# Patient Record
Sex: Male | Born: 1965 | Race: White | Hispanic: No | Marital: Married | Smoking: Former smoker
Health system: Southern US, Community
[De-identification: ages and names within clinical notes are randomized; demographics above are authoritative.]

## PROBLEM LIST (undated history)

## (undated) DIAGNOSIS — J45909 Unspecified asthma, uncomplicated: Secondary | ICD-10-CM

## (undated) HISTORY — PX: WISDOM TOOTH EXTRACTION: SHX21

## (undated) HISTORY — PX: NM RENAL LASIX (ARMC HX): HXRAD1213

---

## 2008-06-26 ENCOUNTER — Encounter: Admission: RE | Admit: 2008-06-26 | Discharge: 2008-06-26 | Payer: Self-pay | Admitting: Family Medicine

## 2012-02-06 ENCOUNTER — Encounter (INDEPENDENT_AMBULATORY_CARE_PROVIDER_SITE_OTHER): Payer: Managed Care, Other (non HMO)

## 2012-02-06 DIAGNOSIS — I4949 Other premature depolarization: Secondary | ICD-10-CM

## 2016-06-05 ENCOUNTER — Encounter (HOSPITAL_BASED_OUTPATIENT_CLINIC_OR_DEPARTMENT_OTHER): Payer: Self-pay

## 2016-06-05 ENCOUNTER — Emergency Department (HOSPITAL_BASED_OUTPATIENT_CLINIC_OR_DEPARTMENT_OTHER)
Admission: EM | Admit: 2016-06-05 | Discharge: 2016-06-05 | Disposition: A | Discharge status: Home / Self Care | Payer: BLUE CROSS/BLUE SHIELD | Attending: Emergency Medicine | Admitting: Emergency Medicine

## 2016-06-05 ENCOUNTER — Emergency Department (HOSPITAL_BASED_OUTPATIENT_CLINIC_OR_DEPARTMENT_OTHER): Payer: BLUE CROSS/BLUE SHIELD

## 2016-06-05 DIAGNOSIS — R509 Fever, unspecified: Secondary | ICD-10-CM | POA: Insufficient documentation

## 2016-06-05 DIAGNOSIS — R69 Illness, unspecified: Secondary | ICD-10-CM

## 2016-06-05 DIAGNOSIS — Z87891 Personal history of nicotine dependence: Secondary | ICD-10-CM | POA: Insufficient documentation

## 2016-06-05 DIAGNOSIS — R05 Cough: Secondary | ICD-10-CM | POA: Diagnosis present

## 2016-06-05 DIAGNOSIS — R5383 Other fatigue: Secondary | ICD-10-CM | POA: Diagnosis not present

## 2016-06-05 DIAGNOSIS — J111 Influenza due to unidentified influenza virus with other respiratory manifestations: Secondary | ICD-10-CM

## 2016-06-05 MED ORDER — IBUPROFEN 800 MG PO TABS
800.0000 mg | ORAL_TABLET | Freq: Once | ORAL | Status: AC
  Administered 2016-06-05: 800 mg via ORAL
  Filled 2016-06-05: qty 1

## 2016-06-05 MED ORDER — OSELTAMIVIR PHOSPHATE 75 MG PO CAPS: 75.0000 mg | ORAL_CAPSULE | Freq: Two times a day (BID) | ORAL | 0 refills | Status: DC

## 2016-06-05 NOTE — ED Provider Notes (Signed)
[facility] Provider Note   CSN: [id number] Arrival date & time: 06/05/16  1708  By signing my name below, I, Paul Kelly, attest that this documentation has been prepared under the direction and in the presence of Gwyneth Sprout, MD. Electronically signed, Paul Kelly, ED Scribe. 06/05/16. 8:33 PM.  History   Chief Complaint Chief Complaint  Patient presents with  . Cough    HPI HPI Comments: Paul Kelly is a 51 y.o. male who presents to the Emergency Department complaining of persistent cough, fever and body aches that started two days ago. Pt did not receive his flu shot this year. Pt reports fever tmax 103 at home and is 103.1 on arrival. He has taken Tylenol at home with no significant relief of symptoms. Pt works at [employer]. No shortness of breath.   The history is provided by the patient. No language interpreter was used.    History reviewed. No pertinent past medical history.  There are no active problems to display for this patient.   Past Surgical History:  Procedure Laterality Date  . NM RENAL LASIX ([facility] HX)       Home Medications    Prior to Admission medications   Not on File   Family History No family history on file.  Social History Social History  Substance Use Topics  . Smoking status: Former Games developer  . Smokeless tobacco: Never Used  . Alcohol use Yes   Allergies   Patient has no known allergies.   Review of Systems Review of Systems  Constitutional: Positive for chills, fatigue and fever.  HENT: Positive for congestion.   Respiratory: Positive for cough. Negative for shortness of breath.   All other systems reviewed and are negative.    Physical Exam Updated Vital Signs BP 113/69 (BP Location: Right Arm)   Pulse 116   Temp 100.3 F (37.9 C) (Oral)   Resp 20   Ht 6' (1.829 m)   Wt 202 lb (91.6 kg)   SpO2 100%   BMI 27.40 kg/m   Physical Exam  Constitutional: He is oriented to person, place, and time. He  appears well-developed and well-nourished.  HENT:  Head: Normocephalic and atraumatic.  Eyes: Conjunctivae are normal.  Neck: Neck supple.  Cardiovascular: Regular rhythm.   Tachycardia  Pulmonary/Chest: Effort normal and breath sounds normal.  Abdominal: Soft. Bowel sounds are normal.  Musculoskeletal: Normal range of motion.  Neurological: He is alert and oriented to person, place, and time.  Skin: Skin is warm.  Clammy skin  Psychiatric: He has a normal mood and affect. His behavior is normal.  Nursing note and vitals reviewed.   ED Treatments / Results  DIAGNOSTIC STUDIES: Oxygen Saturation is 100% on RA, normal by my interpretation.  COORDINATION OF CARE: 8:11 PM-Discussed treatment plan with pt at bedside and pt agreed to plan.   Labs (all labs ordered are listed, but only abnormal results are displayed) Labs Reviewed - No data to display  EKG  EKG Interpretation None       Radiology Dg Chest 2 View  Result Date: 06/05/2016 CLINICAL DATA:  51 year old male with a history of cough and congestion EXAM: CHEST  2 VIEW COMPARISON:  None. FINDINGS: Cardiomediastinal silhouette within normal limits. No central vascular congestion. No interlobular septal thickening. No confluent airspace disease, pneumothorax, or pleural effusion. No displaced fracture. IMPRESSION: No radiographic evidence of acute cardiopulmonary disease. Signed, Yvone Neu. Loreta Ave, DO [facility] Radiology Electronically Signed  By: Gilmer MorJaime  Wagner D.O.   On: 06/05/2016 18:03   Procedures Procedures (including critical care time)  Medications Ordered in ED Medications  ibuprofen (ADVIL,MOTRIN) tablet 800 mg (800 mg Oral Given 06/05/16 1752)    Initial Impression / Assessment and Plan / ED Course  I have reviewed the triage vital signs and the nursing notes.  Pertinent labs & imaging results that were available during my care of the patient were reviewed by  me and considered in my medical decision making (see chart for details).     Pt with symptoms consistent with influenza.  Normal exam here but is febrile.  No signs of breathing difficulty  No signs of strep pharyngitis, otitis or abnormal abdominal findings.   CXR wnl.  Will continue antipyretica and rest and fluids .  Also given tamiflu and return for any further problems.   Final Clinical Impressions(s) / ED Diagnoses   Final diagnoses:  Influenza-like illness    New Prescriptions Discharge Medication List as of 06/05/2016  8:19 PM    START taking these medications   Details  oseltamivir (TAMIFLU) 75 MG capsule Take 1 capsule (75 mg total) by mouth every 12 (twelve) hours., Starting Mon 06/05/2016, Print       I personally performed the services described in this documentation, which was scribed in my presence.  The recorded information has been reviewed and considered.     Gwyneth SproutWhitney Issak Goley, MD 06/05/16 [phone]

## 2016-06-05 NOTE — ED Triage Notes (Signed)
C/o cough,fever x 2 days

## 2020-09-24 ENCOUNTER — Other Ambulatory Visit: Payer: Self-pay | Admitting: Family Medicine

## 2020-09-24 ENCOUNTER — Ambulatory Visit
Admission: RE | Admit: 2020-09-24 | Discharge: 2020-09-24 | Disposition: A | Discharge status: Home / Self Care | Payer: 59 | Source: Ambulatory Visit | Attending: Family Medicine | Admitting: Family Medicine

## 2020-09-24 ENCOUNTER — Other Ambulatory Visit: Payer: Self-pay

## 2020-09-24 DIAGNOSIS — S8991XA Unspecified injury of right lower leg, initial encounter: Secondary | ICD-10-CM

## 2020-09-25 ENCOUNTER — Ambulatory Visit
Admission: RE | Admit: 2020-09-25 | Discharge: 2020-09-25 | Disposition: A | Discharge status: Home / Self Care | Payer: 59 | Source: Ambulatory Visit | Attending: Family Medicine | Admitting: Family Medicine

## 2020-09-25 DIAGNOSIS — S8991XA Unspecified injury of right lower leg, initial encounter: Secondary | ICD-10-CM

## 2020-10-04 ENCOUNTER — Encounter (HOSPITAL_BASED_OUTPATIENT_CLINIC_OR_DEPARTMENT_OTHER): Payer: Self-pay | Admitting: Orthopaedic Surgery

## 2020-10-04 NOTE — Anesthesia Preprocedure Evaluation (Addendum)
Anesthesia Evaluation  Patient identified by MRN, date of birth, ID band Patient awake    Reviewed: Allergy & Precautions, NPO status , Patient's Chart, lab work & pertinent test results  Airway Mallampati: II  TM Distance: >3 FB Neck ROM: Full    Dental  (+) Teeth Intact, Dental Advisory Given   Pulmonary asthma , former smoker,    breath sounds clear to auscultation       Cardiovascular negative cardio ROS   Rhythm:Regular Rate:Normal     Neuro/Psych negative neurological ROS     GI/Hepatic negative GI ROS, Neg liver ROS,   Endo/Other  negative endocrine ROS  Renal/GU negative Renal ROS     Musculoskeletal negative musculoskeletal ROS (+)   Abdominal Normal abdominal exam  (+)   Peds  Hematology negative hematology ROS (+)   Anesthesia Other Findings   Reproductive/Obstetrics                            Anesthesia Physical Anesthesia Plan  ASA: II  Anesthesia Plan: General   Post-op Pain Management:    Induction: Intravenous  PONV Risk Score and Plan: 3 and Propofol infusion, Ondansetron and Dexamethasone  Airway Management Planned: LMA  Additional Equipment: None  Intra-op Plan:   Post-operative Plan: Extubation in OR  Informed Consent: I have reviewed the patients History and Physical, chart, labs and discussed the procedure including the risks, benefits and alternatives for the proposed anesthesia with the patient or authorized representative who has indicated his/her understanding and acceptance.     Dental advisory given  Plan Discussed with: CRNA  Anesthesia Plan Comments: ( Consult on 10/04/20: Patient is very eager to avoid general anesthesia. I discussed both general anesthesia and what that would entail as well as regional + sedation and the downsides of each including recovery time, side effects, and muscle weakness associated with a block. He still prefers to  avoid GA but I advised him that the final decision will be up to his anesthesiologist on the day of surgery. -C. Clemens Catholic, MD)       Anesthesia Quick Evaluation

## 2020-10-04 NOTE — Progress Notes (Signed)
Pt came in to speak with anesthesiologist about the plan for anesthesia for surgery. Consult complete per pt request.

## 2020-10-12 NOTE — H&P (Signed)
PREOPERATIVE H&P  Chief Complaint: MEDIAL MENISCUS TEAR RIGHT KNEE  HPI: Paul Kelly is a 55 y.o. male who is scheduled for, Procedure(s): RIGHT KNEE ARTHROSCOPY WITH MEDIAL MENISECTOMY.   Patient is a healthy 55 year-old who had an injury on May 11th.  He went for a run and had an immediate catch in the medial side of his right knee. He has had continued pain on the medial side of his knee.  His symptoms are rated as moderate to severe, and have been worsening.  This is significantly impairing activities of daily living.    Please see clinic note for further details on this patient's care.    He has elected for surgical management.   Past Medical History:  Diagnosis Date  . Asthma    Past Surgical History:  Procedure Laterality Date  . NM RENAL LASIX ([facility] HX)    . WISDOM TOOTH EXTRACTION     Social History   Socioeconomic History  . Marital status: Married    Spouse name: Not on file  . Number of children: Not on file  . Years of education: Not on file  . Highest education level: Not on file  Occupational History  . Not on file  Tobacco Use  . Smoking status: Former Games developer  . Smokeless tobacco: Never Used  Substance and Sexual Activity  . Alcohol use: Yes  . Drug use: No  . Sexual activity: Not on file  Other Topics Concern  . Not on file  Social History Narrative  . Not on file   Social Determinants of Health   Financial Resource Strain: Not on file  Food Insecurity: Not on file  Transportation Needs: Not on file  Physical Activity: Not on file  Stress: Not on file  Social Connections: Not on file   History reviewed. No pertinent family history. No Known Allergies Prior to Admission medications   Medication Sig Start Date End Date Taking? Authorizing Provider  Misc Natural Products (OSTEO BI-FLEX ADV JOINT SHIELD PO) Take by mouth.   Yes [provider]  Multiple Vitamin (MULTIVITAMIN) tablet Take 1 tablet by mouth daily.   Yes [provider]    ROS: All other systems have been reviewed and were otherwise negative with the exception of those mentioned in the HPI and as above.  Physical Exam: General: Alert, no acute distress Cardiovascular: No pedal edema Respiratory: No cyanosis, no use of accessory musculature GI: No organomegaly, abdomen is soft and non-tender Skin: No lesions in the area of chief complaint Neurologic: Sensation intact distally Psychiatric: Patient is competent for consent with normal mood and affect Lymphatic: No axillary or cervical lymphadenopathy  MUSCULOSKELETAL:  Right knee: Deep flexion demonstrates pain with McMurray's.  Tender to palpation on the medial joint line.  ACL testing is normal.  Minimal effusion today.    Imaging: MRI demonstrates a horizontal tear of the medial meniscus.  No other abnormalities on the meniscus.  He has some mild medial joint cartilage irregularities.    Assessment: MEDIAL MENISCUS TEAR RIGHT KNEE  Plan: Plan for Procedure(s): RIGHT KNEE ARTHROSCOPY WITH MEDIAL MENISECTOMY  The risks benefits and alternatives were discussed with the patient including but not limited to the risks of nonoperative treatment, versus surgical intervention including infection, bleeding, nerve injury,  blood clots, cardiopulmonary complications, morbidity, mortality, among others, and they were willing to proceed.   The patient acknowledged the explanation, agreed to proceed with the plan and consent was signed.   Operative  Plan: Right knee arthroscopy with femoral nerve block and sedation only  Discharge Medications: Standard DVT Prophylaxis: Aspirin Physical Therapy: +/- Special Discharge needs: Knee immobilizer   Vernetta Honey, PA-C  10/12/2020 9:01 PM

## 2020-10-13 ENCOUNTER — Other Ambulatory Visit: Payer: Self-pay

## 2020-10-13 ENCOUNTER — Ambulatory Visit (HOSPITAL_BASED_OUTPATIENT_CLINIC_OR_DEPARTMENT_OTHER): Payer: 59 | Admitting: Anesthesiology

## 2020-10-13 ENCOUNTER — Encounter (HOSPITAL_BASED_OUTPATIENT_CLINIC_OR_DEPARTMENT_OTHER)
Admission: RE | Disposition: A | Discharge status: Home / Self Care | Payer: Self-pay | Source: Home / Self Care | Attending: Orthopaedic Surgery

## 2020-10-13 ENCOUNTER — Ambulatory Visit (HOSPITAL_BASED_OUTPATIENT_CLINIC_OR_DEPARTMENT_OTHER)
Admission: RE | Admit: 2020-10-13 | Discharge: 2020-10-13 | Disposition: A | Discharge status: Home / Self Care | Payer: 59 | Attending: Orthopaedic Surgery | Admitting: Orthopaedic Surgery

## 2020-10-13 ENCOUNTER — Encounter (HOSPITAL_BASED_OUTPATIENT_CLINIC_OR_DEPARTMENT_OTHER): Payer: Self-pay | Admitting: Orthopaedic Surgery

## 2020-10-13 DIAGNOSIS — Y9302 Activity, running: Secondary | ICD-10-CM | POA: Insufficient documentation

## 2020-10-13 DIAGNOSIS — Z87891 Personal history of nicotine dependence: Secondary | ICD-10-CM | POA: Insufficient documentation

## 2020-10-13 DIAGNOSIS — M2341 Loose body in knee, right knee: Secondary | ICD-10-CM | POA: Insufficient documentation

## 2020-10-13 DIAGNOSIS — S83231A Complex tear of medial meniscus, current injury, right knee, initial encounter: Secondary | ICD-10-CM | POA: Insufficient documentation

## 2020-10-13 DIAGNOSIS — J45909 Unspecified asthma, uncomplicated: Secondary | ICD-10-CM | POA: Diagnosis not present

## 2020-10-13 HISTORY — DX: Unspecified asthma, uncomplicated: J45.909

## 2020-10-13 HISTORY — PX: KNEE ARTHROSCOPY WITH MEDIAL MENISECTOMY: SHX5651

## 2020-10-13 SURGERY — ARTHROSCOPY, KNEE, WITH MEDIAL MENISCECTOMY
Anesthesia: General | Site: Knee | Laterality: Right

## 2020-10-13 MED ORDER — KETOROLAC TROMETHAMINE 30 MG/ML IJ SOLN
INTRAMUSCULAR | Status: AC
  Filled 2020-10-13: qty 1

## 2020-10-13 MED ORDER — PROPOFOL 500 MG/50ML IV EMUL
INTRAVENOUS | Status: DC | PRN
  Administered 2020-10-13: 150 ug/kg/min via INTRAVENOUS

## 2020-10-13 MED ORDER — PROPOFOL 10 MG/ML IV BOLUS
INTRAVENOUS | Status: DC | PRN
  Administered 2020-10-13 (×2): 200 mg via INTRAVENOUS

## 2020-10-13 MED ORDER — PROPOFOL 10 MG/ML IV BOLUS
INTRAVENOUS | Status: AC
  Filled 2020-10-13: qty 40

## 2020-10-13 MED ORDER — FENTANYL CITRATE (PF) 100 MCG/2ML IJ SOLN
INTRAMUSCULAR | Status: AC
  Filled 2020-10-13: qty 2

## 2020-10-13 MED ORDER — BUPIVACAINE HCL (PF) 0.25 % IJ SOLN
INTRAMUSCULAR | Status: AC
  Filled 2020-10-13: qty 90

## 2020-10-13 MED ORDER — ACETAMINOPHEN 10 MG/ML IV SOLN: 1000.0000 mg | Freq: Once | INTRAVENOUS | Status: DC | PRN

## 2020-10-13 MED ORDER — CEFAZOLIN SODIUM-DEXTROSE 2-4 GM/100ML-% IV SOLN
2.0000 g | INTRAVENOUS | Status: AC
  Administered 2020-10-13: 2 g via INTRAVENOUS

## 2020-10-13 MED ORDER — FENTANYL CITRATE (PF) 100 MCG/2ML IJ SOLN
INTRAMUSCULAR | Status: DC | PRN
  Administered 2020-10-13: 50 ug via INTRAVENOUS
  Administered 2020-10-13 (×2): 25 ug via INTRAVENOUS

## 2020-10-13 MED ORDER — BUPIVACAINE-EPINEPHRINE (PF) 0.5% -1:200000 IJ SOLN
INTRAMUSCULAR | Status: DC | PRN
  Administered 2020-10-13: 30 mL via PERINEURAL

## 2020-10-13 MED ORDER — ONDANSETRON HCL 4 MG PO TABS: 4.0000 mg | ORAL_TABLET | Freq: Three times a day (TID) | ORAL | 0 refills | Status: AC | PRN

## 2020-10-13 MED ORDER — ACETAMINOPHEN 10 MG/ML IV SOLN
INTRAVENOUS | Status: AC
  Filled 2020-10-13: qty 100

## 2020-10-13 MED ORDER — ACETAMINOPHEN 10 MG/ML IV SOLN
INTRAVENOUS | Status: DC | PRN
  Administered 2020-10-13: 1000 mg via INTRAVENOUS

## 2020-10-13 MED ORDER — CEFAZOLIN SODIUM-DEXTROSE 2-4 GM/100ML-% IV SOLN
INTRAVENOUS | Status: AC
  Filled 2020-10-13: qty 100

## 2020-10-13 MED ORDER — MIDAZOLAM HCL 2 MG/2ML IJ SOLN
INTRAMUSCULAR | Status: AC
  Filled 2020-10-13: qty 2

## 2020-10-13 MED ORDER — ONDANSETRON HCL 4 MG/2ML IJ SOLN
INTRAMUSCULAR | Status: AC
  Filled 2020-10-13: qty 2

## 2020-10-13 MED ORDER — SODIUM CHLORIDE 0.9 % IR SOLN
Status: DC | PRN
  Administered 2020-10-13: 3000 mL

## 2020-10-13 MED ORDER — ASPIRIN 81 MG PO CHEW: 81.0000 mg | CHEWABLE_TABLET | Freq: Two times a day (BID) | ORAL | 0 refills | Status: AC

## 2020-10-13 MED ORDER — ONDANSETRON HCL 4 MG/2ML IJ SOLN
INTRAMUSCULAR | Status: DC | PRN
  Administered 2020-10-13: 4 mg via INTRAVENOUS

## 2020-10-13 MED ORDER — AMISULPRIDE (ANTIEMETIC) 5 MG/2ML IV SOLN: 10.0000 mg | Freq: Once | INTRAVENOUS | Status: DC | PRN

## 2020-10-13 MED ORDER — LIDOCAINE HCL (CARDIAC) PF 100 MG/5ML IV SOSY
PREFILLED_SYRINGE | INTRAVENOUS | Status: DC | PRN
  Administered 2020-10-13: 60 mg via INTRAVENOUS

## 2020-10-13 MED ORDER — ACETAMINOPHEN 500 MG PO TABS: 1000.0000 mg | ORAL_TABLET | Freq: Three times a day (TID) | ORAL | 0 refills | Status: AC

## 2020-10-13 MED ORDER — LIDOCAINE HCL (PF) 2 % IJ SOLN
INTRAMUSCULAR | Status: AC
  Filled 2020-10-13: qty 5

## 2020-10-13 MED ORDER — EPINEPHRINE PF 1 MG/ML IJ SOLN
INTRAMUSCULAR | Status: AC
  Filled 2020-10-13: qty 2

## 2020-10-13 MED ORDER — TRAMADOL HCL 50 MG PO TABS: 50.0000 mg | ORAL_TABLET | Freq: Four times a day (QID) | ORAL | 0 refills | Status: DC | PRN

## 2020-10-13 MED ORDER — ACETAMINOPHEN 325 MG PO TABS: 325.0000 mg | ORAL_TABLET | ORAL | Status: DC | PRN

## 2020-10-13 MED ORDER — BUPIVACAINE HCL (PF) 0.25 % IJ SOLN
INTRAMUSCULAR | Status: AC
  Filled 2020-10-13: qty 60

## 2020-10-13 MED ORDER — PROPOFOL 500 MG/50ML IV EMUL
INTRAVENOUS | Status: AC
  Filled 2020-10-13: qty 150

## 2020-10-13 MED ORDER — IBUPROFEN 800 MG PO TABS: 800.0000 mg | ORAL_TABLET | Freq: Three times a day (TID) | ORAL | 0 refills | Status: AC

## 2020-10-13 MED ORDER — LACTATED RINGERS IV SOLN: INTRAVENOUS | Status: DC

## 2020-10-13 MED ORDER — DEXAMETHASONE SODIUM PHOSPHATE 10 MG/ML IJ SOLN
INTRAMUSCULAR | Status: DC | PRN
  Administered 2020-10-13: 5 mg via INTRAVENOUS

## 2020-10-13 MED ORDER — PROMETHAZINE HCL 25 MG/ML IJ SOLN: 6.2500 mg | INTRAMUSCULAR | Status: DC | PRN

## 2020-10-13 MED ORDER — DEXAMETHASONE SODIUM PHOSPHATE 10 MG/ML IJ SOLN
INTRAMUSCULAR | Status: AC
  Filled 2020-10-13: qty 1

## 2020-10-13 MED ORDER — ACETAMINOPHEN 160 MG/5ML PO SOLN: 325.0000 mg | ORAL | Status: DC | PRN

## 2020-10-13 MED ORDER — KETOROLAC TROMETHAMINE 30 MG/ML IJ SOLN
INTRAMUSCULAR | Status: DC | PRN
  Administered 2020-10-13: 30 mg via INTRAVENOUS

## 2020-10-13 SURGICAL SUPPLY — 36 items
APL PRP STRL LF DISP 70% ISPRP (MISCELLANEOUS) ×1
BNDG ELASTIC 6X5.8 VLCR STR LF (GAUZE/BANDAGES/DRESSINGS) ×2 IMPLANT
CHLORAPREP W/TINT 26 (MISCELLANEOUS) ×2 IMPLANT
CLSR STERI-STRIP ANTIMIC 1/2X4 (GAUZE/BANDAGES/DRESSINGS) ×2 IMPLANT
CUFF TOURN SGL QUICK 34 (TOURNIQUET CUFF) ×2
CUFF TRNQT CYL 34X4.125X (TOURNIQUET CUFF) ×1 IMPLANT
DISSECTOR 3.5MM X 13CM CVD (MISCELLANEOUS) IMPLANT
DISSECTOR 4.0MMX13CM CVD (MISCELLANEOUS) ×2 IMPLANT
DRAPE ARTHROSCOPY W/POUCH 90 (DRAPES) ×2 IMPLANT
DRAPE IMP U-DRAPE 54X76 (DRAPES) ×2 IMPLANT
DRAPE U-SHAPE 47X51 STRL (DRAPES) ×2 IMPLANT
GAUZE SPONGE 4X4 12PLY STRL (GAUZE/BANDAGES/DRESSINGS) ×2 IMPLANT
GLOVE SRG 8 PF TXTR STRL LF DI (GLOVE) ×1 IMPLANT
GLOVE SURG ENC MOIS LTX SZ6.5 (GLOVE) ×4 IMPLANT
GLOVE SURG LTX SZ8 (GLOVE) ×4 IMPLANT
GLOVE SURG UNDER POLY LF SZ6.5 (GLOVE) ×2 IMPLANT
GLOVE SURG UNDER POLY LF SZ7 (GLOVE) ×2 IMPLANT
GLOVE SURG UNDER POLY LF SZ8 (GLOVE) ×2
GOWN STRL REUS W/ TWL LRG LVL3 (GOWN DISPOSABLE) ×1 IMPLANT
GOWN STRL REUS W/TWL LRG LVL3 (GOWN DISPOSABLE) ×2
GOWN STRL REUS W/TWL XL LVL3 (GOWN DISPOSABLE) ×2 IMPLANT
IMMOBILIZER KNEE 24 THIGH 36 (MISCELLANEOUS) ×1 IMPLANT
IMMOBILIZER KNEE 24 UNIV (MISCELLANEOUS) ×2
KIT TURNOVER KIT B (KITS) ×2 IMPLANT
MANIFOLD NEPTUNE II (INSTRUMENTS) ×2 IMPLANT
NDL SAFETY ECLIPSE 18X1.5 (NEEDLE) ×1 IMPLANT
NEEDLE HYPO 18GX1.5 SHARP (NEEDLE) ×2
PACK ARTHROSCOPY DSU (CUSTOM PROCEDURE TRAY) ×2 IMPLANT
PORT APPOLLO RF 90DEGREE MULTI (SURGICAL WAND) IMPLANT
SLEEVE SCD COMPRESS KNEE MED (STOCKING) ×2 IMPLANT
SUT MNCRL AB 4-0 PS2 18 (SUTURE) ×2 IMPLANT
SYR 5ML LUER SLIP (SYRINGE) ×2 IMPLANT
TOWEL GREEN STERILE FF (TOWEL DISPOSABLE) ×2 IMPLANT
TUBE CONNECTING 20X1/4 (TUBING) ×2 IMPLANT
TUBING ARTHROSCOPY IRRIG 16FT (MISCELLANEOUS) ×2 IMPLANT
WATER STERILE IRR 1000ML POUR (IV SOLUTION) ×2 IMPLANT

## 2020-10-13 NOTE — Transfer of Care (Signed)
Immediate Anesthesia Transfer of Care Note  Patient: Paul Kelly  Procedure(s) Performed: RIGHT KNEE ARTHROSCOPY WITH MEDIAL MENISECTOMY (Right Knee)  Patient Location: PACU  Anesthesia Type:GA combined with regional for post-op pain  Level of Consciousness: drowsy  Airway & Oxygen Therapy: Patient Spontanous Breathing and Patient connected to face mask oxygen  Post-op Assessment: Report given to RN and Post -op Vital signs reviewed and stable  Post vital signs: Reviewed and stable  Last Vitals:  Vitals Value Taken Time  BP 150/78 10/13/20 1101  Temp    Pulse 105 10/13/20 1104  Resp 19 10/13/20 1104  SpO2 97 % 10/13/20 1104  Vitals shown include unvalidated device data.  Last Pain:  Vitals:   10/13/20 0859  TempSrc: Oral  PainSc: 0-No pain         Complications: No complications documented.

## 2020-10-13 NOTE — Anesthesia Procedure Notes (Signed)
Anesthesia Regional Block: Adductor canal block   Pre-Anesthetic Checklist: ,, timeout performed, Correct Patient, Correct Site, Correct Laterality, Correct Procedure, Correct Position, site marked, Risks and benefits discussed,  Surgical consent,  Pre-op evaluation,  At surgeon's request and post-op pain management  Laterality: Right  Prep: chloraprep       Needles:  Injection technique: Single-shot  Needle Type: Echogenic Stimulator Needle     Needle Length: 9cm  Needle Gauge: 21     Additional Needles:   Procedures:,,,, ultrasound used (permanent image in chart),,,,  Narrative:  Start time: 10/13/2020 9:50 AM End time: 10/13/2020 9:55 AM Injection made incrementally with aspirations every 5 mL.  Performed by: Personally  Anesthesiologist: Shelton Silvas, MD  Additional Notes: Patient tolerated the procedure well. Local anesthetic introduced in an incremental fashion under minimal resistance after negative aspirations. No paresthesias were elicited. After completion of the procedure, no acute issues were identified and patient continued to be monitored by RN.

## 2020-10-13 NOTE — Progress Notes (Addendum)
Assisted Dr. Hart Rochester with right, ultrasound guided, adductor canal block. Side rails up, monitors on throughout procedure. See vital signs in flow sheet. Tolerated Procedure well. No sedation was used.

## 2020-10-13 NOTE — Anesthesia Procedure Notes (Signed)
Procedure Name: LMA Insertion Date/Time: 10/13/2020 10:27 AM Performed by: Lauralyn Primes, CRNA Pre-anesthesia Checklist: Patient identified, Emergency Drugs available, Suction available and Patient being monitored Patient Re-evaluated:Patient Re-evaluated prior to induction Oxygen Delivery Method: Circle system utilized Preoxygenation: Pre-oxygenation with 100% oxygen Induction Type: IV induction Ventilation: Mask ventilation without difficulty LMA: LMA inserted LMA Size: 5.0 Number of attempts: 1 Airway Equipment and Method: Bite block Placement Confirmation: positive ETCO2 Tube secured with: Tape Dental Injury: Teeth and Oropharynx as per pre-operative assessment

## 2020-10-13 NOTE — Interval H&P Note (Signed)
History and Physical Interval Note:  10/13/2020 10:06 AM  Paul Kelly  has presented today for surgery, with the diagnosis of MEDIAL MENISCUS TEAR RIGHT KNEE.  The various methods of treatment have been discussed with the patient and family. After consideration of risks, benefits and other options for treatment, the patient has consented to  Procedure(s): RIGHT KNEE ARTHROSCOPY WITH MEDIAL MENISECTOMY (Right) as a surgical intervention.  The patient's history has been reviewed, patient examined, no change in status, stable for surgery.  I have reviewed the patient's chart and labs.  Questions were answered to the patient's satisfaction.     Bjorn Pippin

## 2020-10-13 NOTE — Op Note (Signed)
Orthopaedic Surgery Operative Note (CSN: [id number])  Paul Kelly  1965-12-31 Date of Surgery: 10/13/2020   Diagnoses:  MEDIAL MENISCUS TEAR RIGHT KNEE  Procedure: Right knee partial medial meniscectomy and chondroplasty of the medial femoral condyle Right knee loose body excision  Operative Finding Exam under anesthesia: Normal, no instability Suprapatellar pouch: Normal though there was 1 loose body within the knee that was removed, it appeared to be a fragment of meniscus.  1 x 0.5 cm. Patellofemoral Compartment: Normal Medial Compartment: Complex posterior medial meniscus tear involving a radial as well as a horizontal component.  This went from the midpoint to the root.  The root was intact.  We debrided about 30% total meniscal volume.  There was some mild areas of grade 2 change on the distal aspect of the medial femoral condyle that was debrided back to a stable base.  This was not full-thickness. Lateral Compartment: Normal Intercondylar Notch: Normal  Successful completion of the planned procedure.  Patient's knee overall looked well with the exception of the medial femoral condyle and the meniscus.  We will debride back the meniscus to a stable base and we think that he will be able to follow our routine protocols.  Post-operative plan: The patient will be discharged home.  The patient will be weightbearing to tolerance.  DVT prophylaxis Aspirin 81 mg twice daily for 6 weeks.  Pain control with PRN pain medication preferring oral medicines.  Follow up plan will be scheduled in approximately 7 days for incision check and XR.  Post-Op Diagnosis: Same Surgeons:Primary: Bjorn Pippin, MD Assistants:Caroline McBane PA-C Location: [facility] OR ROOM 2 Anesthesia: General with adductor canal Antibiotics: Ancef 2 g Tourniquet time: No tourniquet used Estimated Blood Loss: Minimal Complications: None Specimens: None Implants: * No implants in log *  Indications for Surgery:   Paul Kelly is a 55 y.o. male with mechanical symptoms in his right knee and MRI demonstrating a medial meniscus tear.  Benefits and risks of operative and nonoperative management were discussed prior to surgery with patient/guardian(s) and informed consent form was completed.  Specific risks including infection, need for additional surgery, post meniscectomy syndrome, continued pain from arthritis, stiffness and need for rehab amongst others.   Procedure:   The patient was identified properly. Informed consent was obtained and the surgical site was marked. The patient was taken up to suite where general anesthesia was induced. The patient was placed in the supine position with a post against the surgical leg and a nonsterile tourniquet applied. The surgical leg was then prepped and draped usual sterile fashion.  A standard surgical timeout was performed.  2 standard anterior portals were made and diagnostic arthroscopy performed. Please note the findings as noted above.  We cleared the joint as above.  Loose body was removed from the suprapatellar pouch using a grasper and shaver.  We used a shaver as well as a basket to debride back the medial meniscus to a stable base take about 30% total meniscal volume.  Were able to retain the root itself.  There were still radial fibers intact.  Gentle chondroplasty performed with a shaver.  Incisions closed with absorbable suture. The patient was awoken from general anesthesia and taken to the PACU in stable condition without complication.   Alfonse Alpers, PA-C, present and scrubbed throughout the case, critical for completion in a timely fashion, and for retraction, instrumentation, closure.

## 2020-10-13 NOTE — Discharge Instructions (Signed)
Ramond Marrow MD, MPH Alfonse Alpers, PA-C [facility] [address] [phone] (tel)   [phone] (fax)   POST-OPERATIVE INSTRUCTIONS - Knee Arthroscopy  WOUND CARE - You may remove the Operative Dressing on Post-Op Day #3 (72hrs after surgery).   -  Alternatively if you would like you can leave dressing on until follow-up if within 7-8 days but keep it dry. - Leave steri-strips in place until they fall off on their own, usually 2 weeks postop. - An ACE wrap may be used to control swelling, do not wrap this too tight.  If the initial ACE wrap feels too tight you may loosen it. - There may be a small amount of fluid/bleeding leaking at the surgical site.  - This is normal; the knee is filled with fluid during the procedure and can leak for 24-48hrs after surgery. You may change/reinforce the bandage as needed.  - Use the Cryocuff or Ice as often as possible for the first 7 days, then as needed for pain relief.  - Always keep a towel, ACE wrap or other barrier between the cooling unit and your skin.  - You may shower on Post-Op Day #3. Gently pat the area dry. Do not soak the knee in water or submerge it.  - Do not go swimming in the pool or ocean until 4 weeks after surgery or when otherwise instructed.   - Keep incisions as dry as possible.   BRACE/AMBULATION -  You can use your brace until your nerve block wears off.   - Once your nerve block wears off,  you can remove your brace -            You may use crutches or a walker initially to help you ambulate - You can put full weight on your operative leg as you tolerate  REGIONAL ANESTHESIA (NERVE BLOCKS) - The anesthesia team may have performed a nerve block for you if safe in the setting of your care.  This is a great tool used to minimize pain.  Typically the block may start wearing off overnight.  This can be a challenging period but please utilize your as needed pain medications to try and manage  this period and know it will be a brief transition as the nerve block wears completely   POST-OP MEDICATIONS - Multimodal approach to pain control - In general your pain will be controlled with a combination of substances.  Prescriptions unless otherwise discussed are electronically sent to your pharmacy.  This is a carefully made plan we use to minimize narcotic use.     - Ibuprofen - Anti-inflammatory medication taken on a scheduled basis   - Take 1 tablet three times a day - Acetaminophen - Non-narcotic pain medicine taken on a scheduled basis    - Take two 500 mg tablets (1,000 mg total) every 8 hours - Tramadol  - This is a strong narcotic, to be used only on an "as needed" basis for severe pain. - Aspirin 81mg  - This medicine is used to minimize the risk of blood clots after surgery.   - Take 1 tablet twice a day for 6 weeks -  Zofran - take as needed for nausea   FOLLOW-UP   Please call the office to schedule a follow-up appointment for your incision check, 7-10 days post-operatively.   IF YOU HAVE ANY QUESTIONS, PLEASE FEEL FREE TO CALL OUR OFFICE.   HELPFUL INFORMATION  - If you had a block,  it will wear off between 8-24 hrs postop typically.  This is period when your pain may go from nearly zero to the pain you would have had post-op without the block.  This is an abrupt transition but nothing dangerous is happening.  You may take an extra dose of narcotic when this happens.   Keep your leg elevated to decrease swelling, which will then in turn decrease your pain. I would elevate the foot of your bed by putting a couple of couch pillows between your mattress and box spring. I would not keep pillow directly under your ankle.  - Do not sleep with a pillow behind your knee even if it is more comfortable as this may make it harder to get your knee fully straight long term.   There will be MORE swelling on days 1-3 than there is on the day of surgery.  This also is normal. The  swelling will decrease with the anti-inflammatory medication, ice and keeping it elevated. The swelling will make it more difficult to bend your knee. As the swelling goes down your motion will become easier   You may develop swelling and bruising that extends from your knee down to your calf and perhaps even to your foot over the next week. Do not be alarmed. This too is normal, and it is due to gravity   There may be some numbness adjacent to the incision site. This may last for 6-12 months or longer in some patients and is expected.   You may return to sedentary work/school in the next couple of days when you feel up to it. You will need to keep your leg elevated as much as possible    You should wean off your narcotic medicines as soon as you are able.  Most patients will be off or using minimal narcotics before their first postop appointment.    We suggest you use the pain medication the first night prior to going to bed, in order to ease any pain when the anesthesia wears off. You should avoid taking pain medications on an empty stomach as it will make you nauseous.   Do not drink alcoholic beverages or take illicit drugs when taking pain medications.   It is against the law to drive while taking narcotics. You cannot drive if your Right leg is in brace locked in extension.   Pain medication may make you constipated.  Below are a few solutions to try in this order:  o Decrease the amount of pain medication if you aren't having pain.  o Drink lots of decaffeinated fluids.  o Drink prune juice and/or eat dried prunes   o If the first 3 don't work start with additional solutions  o Take Colace - an over-the-counter stool softener  o Take Senokot - an over-the-counter laxative  o Take Miralax - a stronger over-the-counter laxative    For more information including helpful videos and documents visit our website:   [email or web address]    Post  Anesthesia Home Care Instructions  Activity: Get plenty of rest for the remainder of the day. A responsible individual must stay with you for 24 hours following the procedure.  For the next 24 hours, DO NOT: -Drive a car -Advertising copywriter -Drink alcoholic beverages -Take any medication unless instructed by your physician -Make any legal decisions or sign important papers.  Meals: Start with liquid foods such as gelatin or soup. Progress to regular foods as tolerated. Avoid greasy, spicy, heavy foods. If nausea  and/or vomiting occur, drink only clear liquids until the nausea and/or vomiting subsides. Call your physician if vomiting continues.  Special Instructions/Symptoms: Your throat may feel dry or sore from the anesthesia or the breathing tube placed in your throat during surgery. If this causes discomfort, gargle with warm salt water. The discomfort should disappear within 24 hours.  If you had a scopolamine patch placed behind your ear for the management of post- operative nausea and/or vomiting:  1. The medication in the patch is effective for 72 hours, after which it should be removed.  Wrap patch in a tissue and discard in the trash. Wash hands thoroughly with soap and water. 2. You may remove the patch earlier than 72 hours if you experience unpleasant side effects which may include dry mouth, dizziness or visual disturbances. 3. Avoid touching the patch. Wash your hands with soap and water after contact with the patch.     Regional Anesthesia Blocks  1. Numbness or the inability to move the "blocked" extremity may last from 3-48 hours after placement. The length of time depends on the medication injected and your individual response to the medication. If the numbness is not going away after 48 hours, call your surgeon.  2. The extremity that is blocked will need to be protected until the numbness is gone and the  Strength has returned. Because you cannot feel it, you will need  to take extra care to avoid injury. Because it may be weak, you may have difficulty moving it or using it. You may not know what position it is in without looking at it while the block is in effect.  3. For blocks in the legs and feet, returning to weight bearing and walking needs to be done carefully. You will need to wait until the numbness is entirely gone and the strength has returned. You should be able to move your leg and foot normally before you try and bear weight or walk. You will need someone to be with you when you first try to ensure you do not fall and possibly risk injury.  4. Bruising and tenderness at the needle site are common side effects and will resolve in a few days.  5. Persistent numbness or new problems with movement should be communicated to the surgeon or the [facility] [phone] [facility] [phone]).  No tylenol until after 4:45pm if needed today.  No ibuprofen until after 4:45pm if needed today.

## 2020-10-13 NOTE — Anesthesia Postprocedure Evaluation (Signed)
Anesthesia Post Note  Patient: Paul Kelly  Procedure(s) Performed: RIGHT KNEE ARTHROSCOPY WITH MEDIAL MENISECTOMY (Right Knee)     Patient location during evaluation: PACU Anesthesia Type: General Level of consciousness: awake and alert Pain management: pain level controlled Vital Signs Assessment: post-procedure vital signs reviewed and stable Respiratory status: spontaneous breathing, nonlabored ventilation, respiratory function stable and patient connected to nasal cannula oxygen Cardiovascular status: blood pressure returned to baseline and stable Postop Assessment: no apparent nausea or vomiting Anesthetic complications: no   No complications documented.  Last Vitals:  Vitals:   10/13/20 1200 10/13/20 1210  BP:  123/74  Pulse: 68 62  Resp: 17 18  Temp:  36.7 C  SpO2: 96% 97%    Last Pain:  Vitals:   10/13/20 1210  TempSrc:   PainSc: 0-No pain                 Shelton Silvas

## 2020-10-14 ENCOUNTER — Encounter (HOSPITAL_BASED_OUTPATIENT_CLINIC_OR_DEPARTMENT_OTHER): Payer: Self-pay | Admitting: Orthopaedic Surgery

## 2021-01-06 ENCOUNTER — Ambulatory Visit
Admission: RE | Admit: 2021-01-06 | Discharge: 2021-01-06 | Disposition: A | Discharge status: Home / Self Care | Payer: 59 | Source: Ambulatory Visit | Attending: Family Medicine | Admitting: Family Medicine

## 2021-01-06 ENCOUNTER — Ambulatory Visit: Payer: 59

## 2021-01-06 ENCOUNTER — Other Ambulatory Visit: Payer: Self-pay | Admitting: Family Medicine

## 2021-01-06 ENCOUNTER — Other Ambulatory Visit: Payer: Self-pay

## 2021-01-06 DIAGNOSIS — R0789 Other chest pain: Secondary | ICD-10-CM

## 2021-01-06 DIAGNOSIS — R52 Pain, unspecified: Secondary | ICD-10-CM

## 2021-02-04 ENCOUNTER — Other Ambulatory Visit: Payer: Self-pay | Admitting: Family Medicine

## 2021-02-04 DIAGNOSIS — R0789 Other chest pain: Secondary | ICD-10-CM

## 2021-02-04 DIAGNOSIS — R1013 Epigastric pain: Secondary | ICD-10-CM

## 2021-02-10 ENCOUNTER — Ambulatory Visit
Admission: RE | Admit: 2021-02-10 | Discharge: 2021-02-10 | Disposition: A | Discharge status: Home / Self Care | Payer: 59 | Source: Ambulatory Visit | Attending: Family Medicine | Admitting: Family Medicine

## 2021-02-10 DIAGNOSIS — R1013 Epigastric pain: Secondary | ICD-10-CM

## 2021-02-10 DIAGNOSIS — R0789 Other chest pain: Secondary | ICD-10-CM

## 2021-08-13 IMAGING — MR MR KNEE*R* W/O CM
5 of 7 series · 24 of 40 positions shown · non-contrast
Comparison: Right knee x-rays dated September 24, 2020.

CLINICAL DATA: Acute medial knee pain after injury 3 days ago. No
prior surgery.

EXAM:
MRI OF THE RIGHT KNEE WITHOUT CONTRAST
TECHNIQUE: Multiplanar, multisequence MR imaging of the knee was performed. No
intravenous contrast was administered.

[Series 3: T2 fat-sat · axial · 4.0mm · 0.50mm/px · z∈[-82,+63]mm · 7 of 30 slices shown (1 of 2)]
[im 1/30]
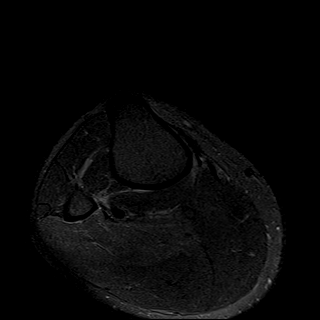
[im 5/30]
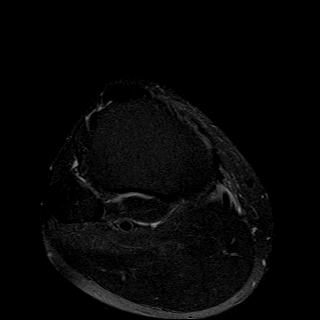
[im 10/30]
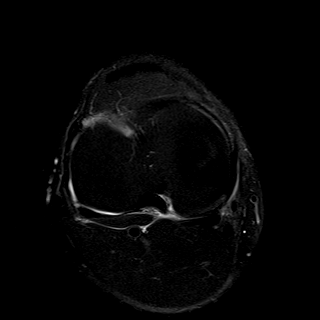
[im 15/30]
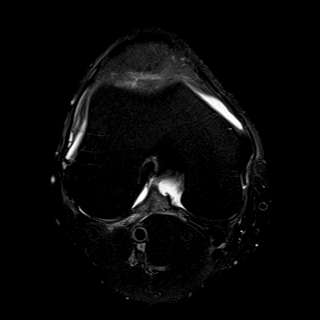
[im 20/30]
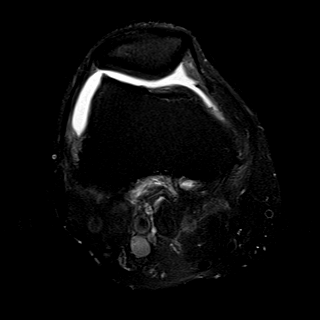
[im 25/30]
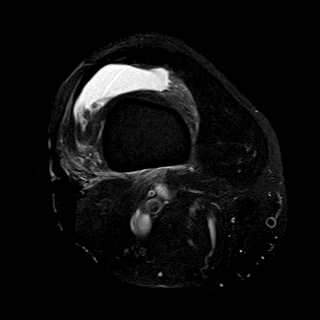
[im 30/30]
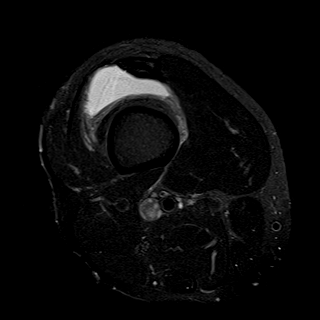

[Series 5: T2 fat-sat · coronal · 4.0mm · 0.39mm/px · 2 of 26 slices shown (2 of 2)]
[im 1/26]
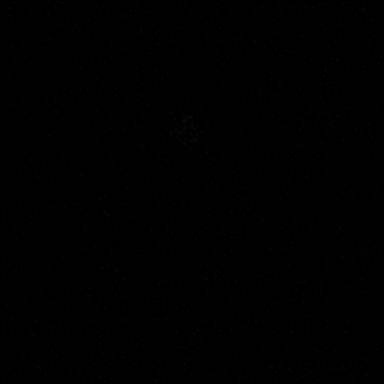
[im 6/26]
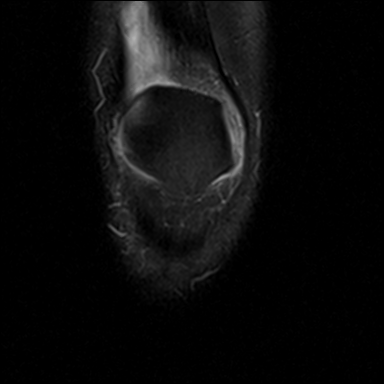

[Series 6: PD fat-sat · coronal · 3.0mm · 0.29mm/px · 6 of 28 slices shown (1 of 3)]
[im 1/28]
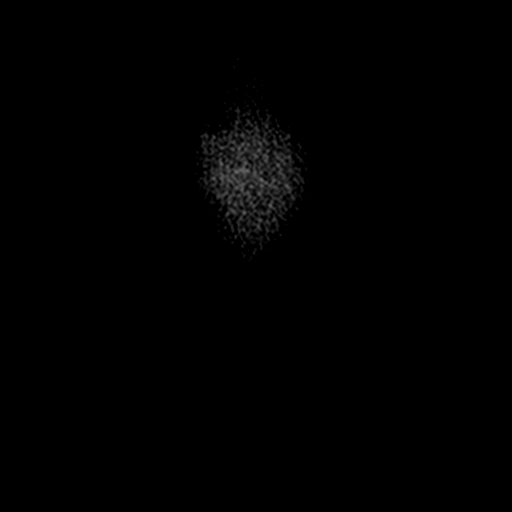
[im 6/28]
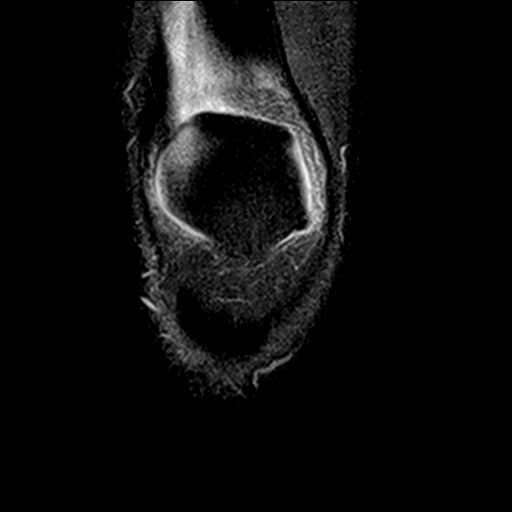
[im 11/28]
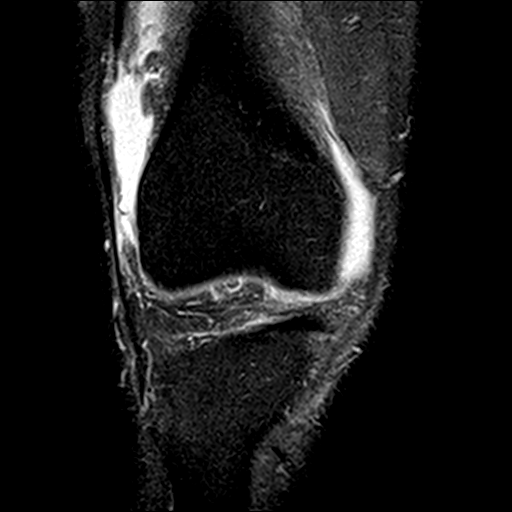
[im 17/28]
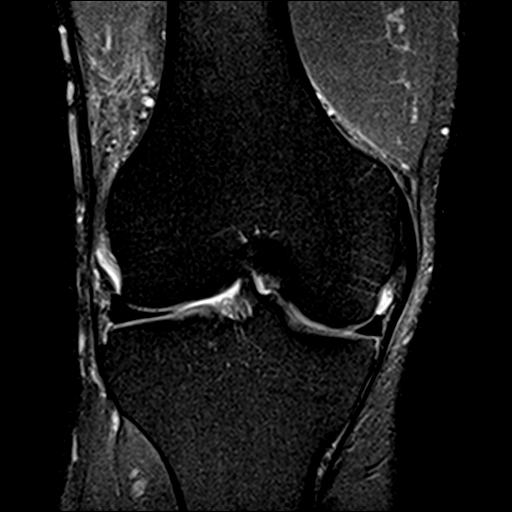
[im 22/28]
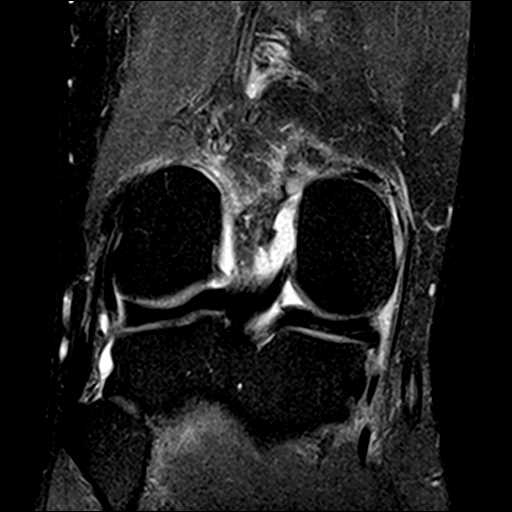
[im 28/28]
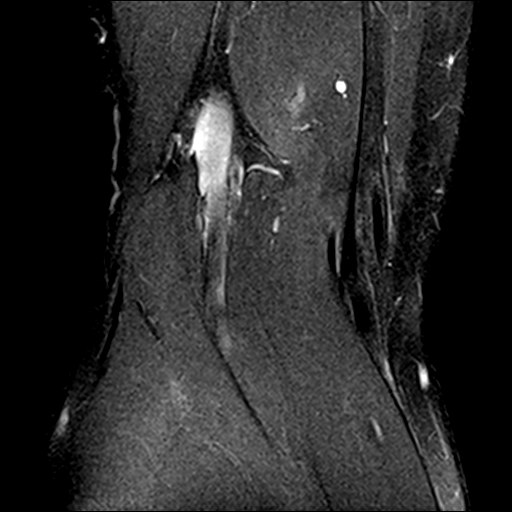

[Series 7: PD fat-sat · sagittal · 3.0mm · 0.29mm/px · 7 of 30 slices shown (2 of 3)]
[im 1/30]
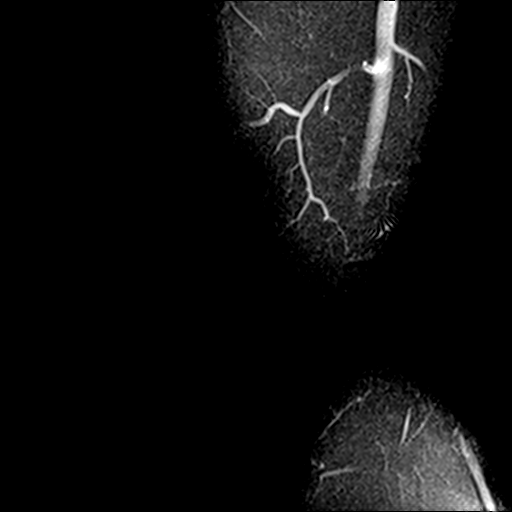
[im 5/30]
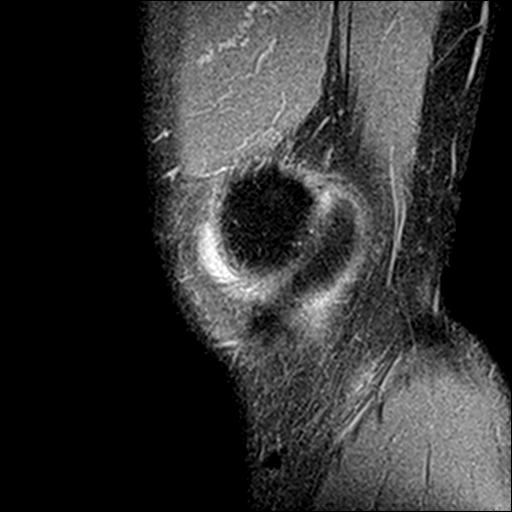
[im 10/30]
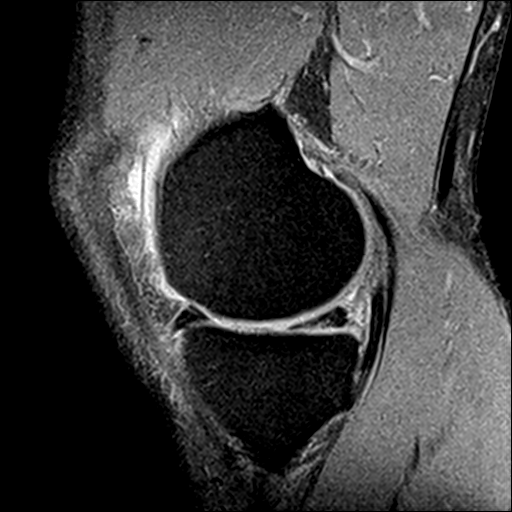
[im 15/30]
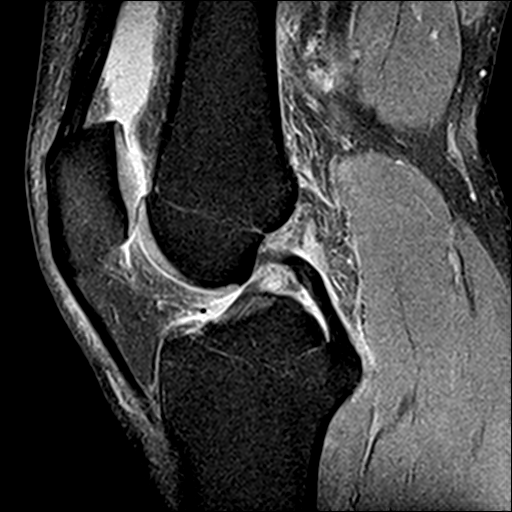
[im 20/30]
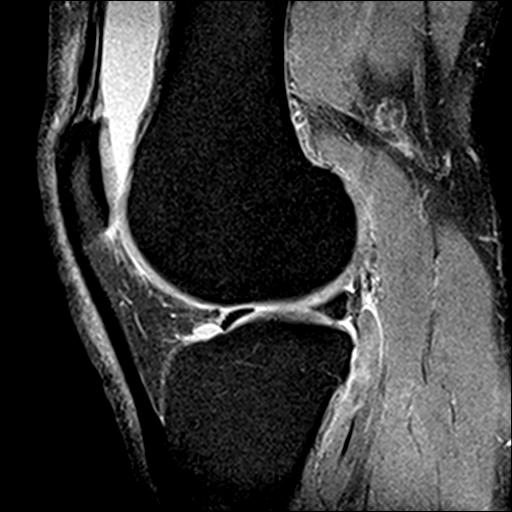
[im 25/30]
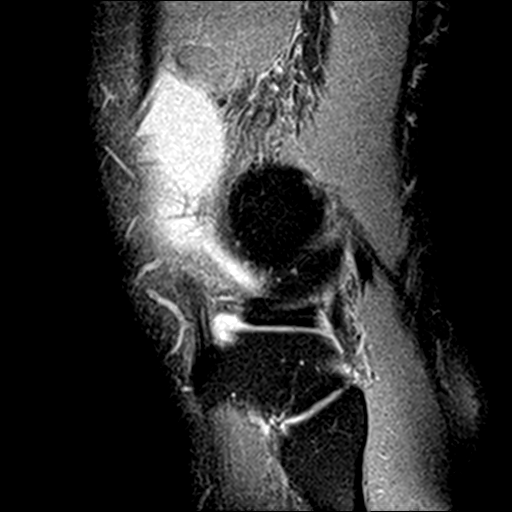
[im 30/30]
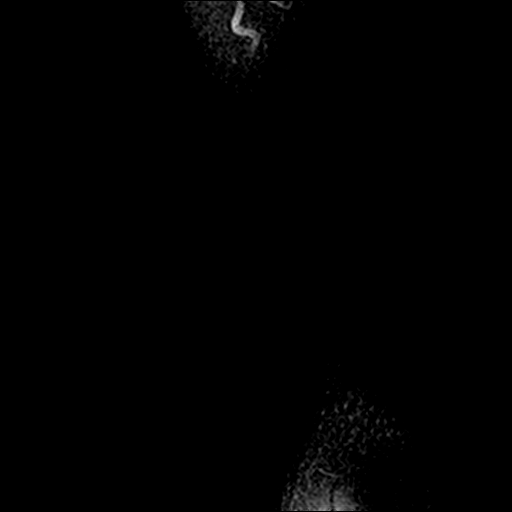

[Series 9: PD fat-sat · oblique · 2.0mm · 0.29mm/px · 2 of 11 slices shown (3 of 3)]
[im 1/11]
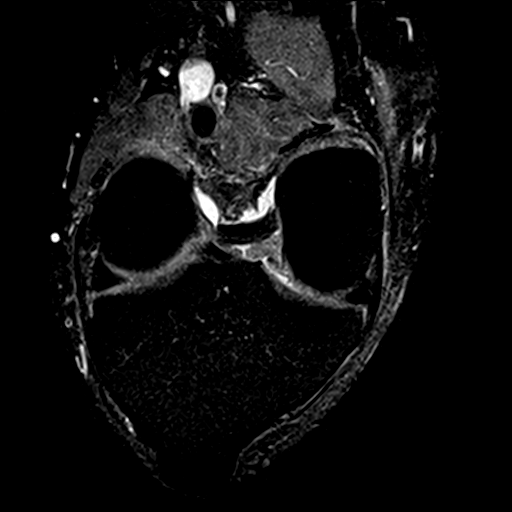
[im 11/11]
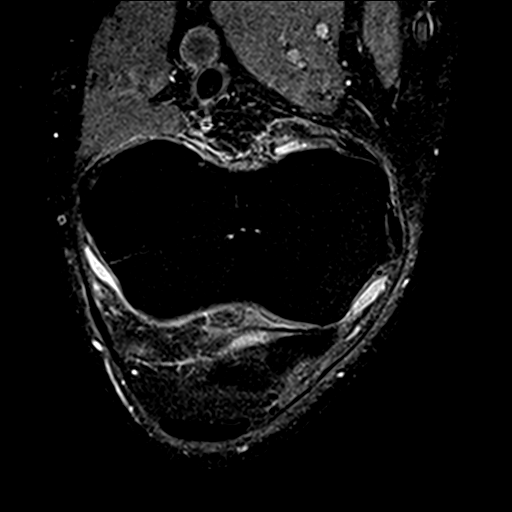

[24 of 40 positions shown; findings below may reference images not displayed]

FINDINGS: MENISCI

Medial meniscus: Horizontal tear of the posterior horn with free
edge fraying.

Lateral meniscus:  Intact.

LIGAMENTS

Cruciates:  Intact ACL and PCL.

Collaterals: Medial collateral ligament is intact. Lateral
collateral ligament complex is intact.

CARTILAGE

Patellofemoral:  No chondral defect.

Medial: Mild partial-thickness cartilage loss over the medial
femoral condyle.

Lateral:  No chondral defect.

Joint:  Large joint effusion.  Normal Hoffa's fat.

Popliteal Fossa:  No Baker cyst. Intact popliteus tendon.

Extensor Mechanism: Intact quadriceps tendon and patellar tendon.
Intact medial and lateral patellar retinaculum. Intact MPFL.

Bones: No focal marrow signal abnormality. No fracture or
dislocation.

Other: None.
IMPRESSION: 1. Horizontal tear of the medial meniscus posterior horn with free
edge fraying.
2. Mild medial compartment osteoarthritis.
3. Large joint effusion.

## 2022-08-29 ENCOUNTER — Other Ambulatory Visit: Payer: Self-pay | Admitting: Family Medicine

## 2022-08-29 DIAGNOSIS — R413 Other amnesia: Secondary | ICD-10-CM

## 2022-09-01 ENCOUNTER — Other Ambulatory Visit: Payer: Self-pay | Admitting: Family Medicine

## 2022-09-01 ENCOUNTER — Ambulatory Visit
Admission: RE | Admit: 2022-09-01 | Discharge: 2022-09-01 | Disposition: A | Discharge status: Home / Self Care | Payer: Managed Care, Other (non HMO) | Source: Ambulatory Visit | Attending: Family Medicine

## 2022-09-01 DIAGNOSIS — R413 Other amnesia: Secondary | ICD-10-CM

## 2022-09-01 DIAGNOSIS — M795 Residual foreign body in soft tissue: Secondary | ICD-10-CM

## 2023-12-18 ENCOUNTER — Ambulatory Visit: Admitting: Sports Medicine

## 2024-01-07 NOTE — Progress Notes (Unsigned)
    Ben Jackson D.[facility] [address] [place] [address] Phone: [phone]   Assessment and Plan:     1. Chronic bilateral low back pain without sciatica (Primary) -Chronic with exacerbation, initial sports medicine visit - Chronic, nonradiating low back pain most consistent with lumbar DDD and muscular dysfunction causing intermittent flares of pain - Start meloxicam  15 mg daily x2 weeks.  If still having pain after 2 weeks, complete 3rd-week of NSAID. May use remaining NSAID as needed once daily for pain control.  Do not to use additional over-the-counter NSAIDs (ibuprofen , naproxen, Advil , Aleve, etc.) while taking prescription NSAIDs.  May use Tylenol  [id number] mg 2 to 3 times a day for breakthrough pain. -Start HEP for low back and core - Will obtain lumbar x-ray.  Patient prefers to go to [facility] imaging.  Can review x-ray at follow-up visit   Pertinent previous records reviewed include none  Follow Up: 4 to 6 weeks for reevaluation.  Could discuss OMT versus physical therapy versus advanced imaging if no improvement or worsening symptoms   Subjective:   I, Harris Penton, am serving as a Neurosurgeon for Doctor Morene Mace  Chief Complaint: back pain   HPI:   01/08/2024 Patient is a 58 year old male with back pain. Patient states low back pain across the back. Stretching helps a little. Intermittent ibu use and that helps. No radiating pain. Decreased ROM. Laying down is fine. Flexion hurts when putting on pants.   Relevant Historical Information: None pertinent  Additional pertinent review of systems negative.   Current Outpatient Medications:    meloxicam  (MOBIC ) 15 MG tablet, Take 1 tablet (15 mg total) by mouth daily., Disp: 30 tablet, Rfl: 0   ibuprofen  (ADVIL ) 800 MG tablet, Take 1 tablet (800 mg total) by mouth every 8 (eight) hours. For pain / inflammation, Disp: 60 tablet, Rfl: 0   Misc Natural Products (OSTEO  BI-FLEX ADV JOINT SHIELD PO), Take by mouth., Disp: , Rfl:    Multiple Vitamin (MULTIVITAMIN) tablet, Take 1 tablet by mouth daily., Disp: , Rfl:    Objective:     Vitals:   01/08/24 0842  BP: 126/78  Pulse: 82  SpO2: 97%  Weight: 215 lb (97.5 kg)  Height: 6' (1.829 m)      Body mass index is 29.16 kg/m.    Physical Exam:    Gen: Appears well, nad, nontoxic and pleasant Psych: Alert and oriented, appropriate mood and affect Neuro: sensation intact, strength is 5/5 in upper and lower extremities, muscle tone wnl Skin: no susupicious lesions or rashes  Back - Normal skin, Spine with normal alignment and no deformity.   No tenderness to vertebral process palpation.   Paraspinous muscles are not tender and without spasm NTTP gluteal musculature TTP mildly left sacral base Nonradiating low back pain with lumbar flexion and extension focused around L4-L5 Straight leg raise negative Trendelenberg negative Piriformis Test negative Gait normal    Electronically signed by:  Odis Mace D.[facility] 9:12 AM 01/08/24

## 2024-01-08 ENCOUNTER — Ambulatory Visit: Admitting: Sports Medicine

## 2024-01-08 ENCOUNTER — Ambulatory Visit
Admission: RE | Admit: 2024-01-08 | Discharge: 2024-01-08 | Disposition: A | Discharge status: Home / Self Care | Source: Ambulatory Visit | Attending: Sports Medicine | Admitting: Sports Medicine

## 2024-01-08 ENCOUNTER — Ambulatory Visit: Payer: Self-pay | Admitting: Sports Medicine

## 2024-01-08 VITALS — BP 126/78 | HR 82 | Ht 72.0 in | Wt 215.0 lb

## 2024-01-08 DIAGNOSIS — G8929 Other chronic pain: Secondary | ICD-10-CM

## 2024-01-08 DIAGNOSIS — M545 Low back pain, unspecified: Secondary | ICD-10-CM

## 2024-01-08 MED ORDER — MELOXICAM 15 MG PO TABS: 15.0000 mg | ORAL_TABLET | Freq: Every day | ORAL | 0 refills | Status: AC

## 2024-01-08 NOTE — Patient Instructions (Addendum)
-   Start meloxicam  15 mg daily x2 weeks.  If still having pain after 2 weeks, complete 3rd-week of NSAID. May use remaining NSAID as needed once daily for pain control.  Do not to use additional over-the-counter NSAIDs (ibuprofen , naproxen, Advil , Aleve, etc.) while taking prescription NSAIDs.  May use Tylenol  [phone] mg 2 to 3 times a day for breakthrough pain.  Xrays on the way out   Low back HEP  4-6 week follow up

## 2024-02-05 ENCOUNTER — Ambulatory Visit: Admitting: Sports Medicine

## 2024-02-08 NOTE — Progress Notes (Signed)
    [facility] [address] [place] [address] Phone: [phone]   Assessment and Plan:     1. Chronic bilateral low back pain without sciatica (Primary) 2. Degeneration of intervertebral disc of lumbar region with discogenic back pain -Chronic with exacerbation, subsequent visit -Continued, nonradiating low back pain still most consistent with lumbar DDD and localized muscular dysfunction causing intermittent flares of pain.  No significant relief with HEP and meloxicam  course - Discontinue meloxicam  - Continue HEP - Start prednisone Dosepak -Reviewed patient's lumbar x-ray which showed mild DDD at L3-L4, and mild stenosis at L4-L5  Pertinent previous records reviewed include lumbar x-ray 01/08/24   Follow Up: Recommend patient reach out to our clinic in 1 week if no improvement or worsening of symptoms.  That would mark 6 weeks from starting conservative therapy and if no improvement, could consider lumbar MRI   Subjective:   I, Paul Kelly, am serving as a Neurosurgeon for Doctor Paul Kelly   Chief Complaint: back pain    HPI:    01/08/2024 Patient is a 58 year old male with back pain. Patient states low back pain across the back. Stretching helps a little. Intermittent ibu use and that helps. No radiating pain. Decreased ROM. Laying down is fine. Flexion hurts when putting on pants.   02/11/2024 Patient states he is the same    Relevant Historical Information: None pertinent  Additional pertinent review of systems negative.   Current Outpatient Medications:    methylPREDNISolone (MEDROL DOSEPAK) 4 MG TBPK tablet, Take 6 tablets on day 1.  Take 5 tablets on day 2.  Take 4 tablets on day 3.  Take 3 tablets on day 4.  Take 2 tablets on day 5.  Take 1 tablet on day 6., Disp: 21 tablet, Rfl: 0   ibuprofen  (ADVIL ) 800 MG tablet, Take 1 tablet (800 mg total) by mouth every 8 (eight) hours. For pain / inflammation, Disp:  60 tablet, Rfl: 0   meloxicam  (MOBIC ) 15 MG tablet, Take 1 tablet (15 mg total) by mouth daily., Disp: 30 tablet, Rfl: 0   Misc Natural Products (OSTEO BI-FLEX ADV JOINT SHIELD PO), Take by mouth., Disp: , Rfl:    Multiple Vitamin (MULTIVITAMIN) tablet, Take 1 tablet by mouth daily., Disp: , Rfl:    Objective:     Vitals:   02/11/24 0804  BP: 132/74  Pulse: 79  SpO2: 95%  Weight: 207 lb (93.9 kg)  Height: 6' (1.829 m)      Body mass index is 28.07 kg/m.    Physical Exam:    Gen: Appears well, nad, nontoxic and pleasant Psych: Alert and oriented, appropriate mood and affect Neuro: sensation intact, strength is 5/5 in upper and lower extremities, muscle tone wnl Skin: no susupicious lesions or rashes   Back - Normal skin, Spine with normal alignment and no deformity.   No tenderness to vertebral process palpation.   Paraspinous muscles are not tender and without spasm NTTP gluteal musculature TTP mildly left sacral base Nonradiating low back pain with lumbar flexion and extension focused around L4-L5 Straight leg raise negative Trendelenberg negative Piriformis Test negative Gait normal    Electronically signed by:  Odis [facility] 8:29 AM 02/11/24

## 2024-02-11 ENCOUNTER — Ambulatory Visit: Admitting: Sports Medicine

## 2024-02-11 VITALS — BP 132/74 | HR 79 | Ht 72.0 in | Wt 207.0 lb

## 2024-02-11 DIAGNOSIS — G8929 Other chronic pain: Secondary | ICD-10-CM

## 2024-02-11 DIAGNOSIS — M5136 Other intervertebral disc degeneration, lumbar region with discogenic back pain only: Secondary | ICD-10-CM | POA: Diagnosis not present

## 2024-02-11 MED ORDER — METHYLPREDNISOLONE 4 MG PO TBPK: ORAL_TABLET | ORAL | 0 refills | Status: AC

## 2024-02-11 NOTE — Patient Instructions (Signed)
 Prednisone dos pak   Continue HEP   Contact us  in 1 week if no better and we will order an MRI

## 2024-03-04 ENCOUNTER — Encounter: Payer: Self-pay | Admitting: Sports Medicine

## 2024-03-05 ENCOUNTER — Other Ambulatory Visit: Payer: Self-pay | Admitting: Sports Medicine

## 2024-03-05 DIAGNOSIS — M545 Low back pain, unspecified: Secondary | ICD-10-CM

## 2024-03-05 NOTE — Telephone Encounter (Signed)
Referral placed patient notified via mychart

## 2024-03-05 NOTE — Telephone Encounter (Signed)
 1. Chronic bilateral low back pain without sciatica (Primary) 2. Degeneration of intervertebral disc of lumbar region with discogenic back pain -Chronic with exacerbation - Patient has failed >6 weeks of conservative therapy which has included NSAID course, prednisone course, physician directed HEP.  He continues to have low back pain that is significant in nature, affecting day-to-day activities, >6/10 in scale.  X-ray on file.  Recommend further evaluation with lumbar spine MRI without contrast and will follow-up in clinic 1 week after imaging to review results.  Could consider epidural if appropriate

## 2024-03-21 ENCOUNTER — Ambulatory Visit
Admission: RE | Admit: 2024-03-21 | Discharge: 2024-03-21 | Disposition: A | Discharge status: Home / Self Care | Source: Ambulatory Visit | Attending: Sports Medicine | Admitting: Sports Medicine

## 2024-03-21 DIAGNOSIS — M545 Low back pain, unspecified: Secondary | ICD-10-CM

## 2024-03-25 NOTE — Progress Notes (Unsigned)
 [facility] [address] [place] [address] Phone: [phone]   Assessment and Plan:     1. Chronic bilateral low back pain without sciatica (Primary) 2. Degeneration of intervertebral disc of lumbar region with discogenic back pain -Chronic with exacerbation, subsequent visit - Reviewed lumbar MRI which showed multilevel disc and facet degeneration without stenosis, mild lateral recess and up to moderate neuroforaminal stenosis at L3-L4 and L4-L5 - Continued intermittent low back pain likely due to degenerative changes seen primarily at L3-L4 and L4-L5.  Recommend patient reach out to our clinic once he decides how he would like to proceed.  Treatment options include the following: - Use NSAIDs   daily as needed for breakthrough pain.  Recommend limiting chronic NSAIDs to 1-2 doses per week to prevent long-term side effects. Use Tylenol  500 to 1000 mg tablets 2-3 times a day as needed for day-to-day pain relief. - Starting physical therapy and/or osteopathic manipulation - Proceeding with epidural CSI and if epidural CSI fails, continuing to medial branch block at levels of most significant degenerative change  3.  Biceps tendinitis, right - Acute, initial visit - Most consistent with tendinitis of long head of biceps tendon - Avoid biceps and overhead shoulder activities for 2 weeks and then gradually return to activity as tolerated - Use topical Voltaren gel over areas of pain - Start HEP for shoulder and biceps  15 additional minutes spent for educating Therapeutic Home Exercise Program.  This included exercises focusing on stretching, strengthening, with focus on eccentric aspects.   Long term goals include an improvement in range of motion, strength, endurance as well as avoiding reinjury. Patient's frequency would include in 1-2 times a day, 3-5 times a week for a duration of 6-12 weeks. Proper technique shown and discussed  handout in great detail with ATC.  All questions were discussed and answered.    Please reach out to our clinic when you are ready to take neck steps and treatment for your back and/or shoulder  Pertinent previous records reviewed include lumbar MRI 03/21/2024   Follow Up: Recommend patient reach out to our clinic once he decides how he would like to proceed for back treatment or if shoulder pain does not resolve   Subjective:   I, Paul Kelly, am serving as a neurosurgeon for Doctor Paul Kelly   Chief Complaint: back pain    HPI:    01/08/2024 Patient is a 58 year old male with back pain. Patient states low back pain across the back. Stretching helps a little. Intermittent ibu use and that helps. No radiating pain. Decreased ROM. Laying down is fine. Flexion hurts when putting on pants.    02/11/2024 Patient states he is the same   03/26/2024 Patient states low back is so so. Right shoulder pain is present now. Anterior shoulder pain   Relevant Historical Information: None pertinent  Additional pertinent review of systems negative.   Current Outpatient Medications:    ibuprofen  (ADVIL ) 800 MG tablet, Take 1 tablet (800 mg total) by mouth every 8 (eight) hours. For pain / inflammation, Disp: 60 tablet, Rfl: 0   meloxicam  (MOBIC ) 15 MG tablet, Take 1 tablet (15 mg total) by mouth daily., Disp: 30 tablet, Rfl: 0   methylPREDNISolone (MEDROL DOSEPAK) 4 MG TBPK tablet, Take 6 tablets on day 1.  Take 5 tablets on day 2.  Take 4 tablets on day 3.  Take 3 tablets on day 4.  Take  2 tablets on day 5.  Take 1 tablet on day 6., Disp: 21 tablet, Rfl: 0   Misc Natural Products (OSTEO BI-FLEX ADV JOINT SHIELD PO), Take by mouth., Disp: , Rfl:    Multiple Vitamin (MULTIVITAMIN) tablet, Take 1 tablet by mouth daily., Disp: , Rfl:    Objective:     Vitals:   03/26/24 0903  BP: 122/78  Pulse: 61  SpO2: 96%  Weight: 217 lb (98.4 kg)  Height: 6' (1.829 m)      Body mass index is 29.43  kg/m.    Physical Exam:    Gen: Appears well, nad, nontoxic and pleasant Psych: Alert and oriented, appropriate mood and affect Neuro: sensation intact, strength is 5/5 in upper and lower extremities, muscle tone wnl Skin: no susupicious lesions or rashes   Back - Normal skin, Spine with normal alignment and no deformity.   No tenderness to vertebral process palpation.   Paraspinous muscles are not tender and without spasm NTTP gluteal musculature TTP mildly left sacral base Nonradiating low back pain with lumbar flexion and extension focused around L4-L5 Straight leg raise negative Trendelenberg negative Piriformis Test negative Gait normal   Gen: Appears well, nad, nontoxic and pleasant Neuro:sensation intact, strength is 5/5, muscle tone wnl Skin: no suspicious lesion or defmority Psych: A&O, appropriate mood and affect  Right shoulder:  No deformity, swelling or muscle wasting No scapular winging FF 180, abd 180, int 0, ext 90 NTTP over the [place], clavicle, ac, coracoid, biceps groove, humerus, deltoid, trapezius, cervical spine Positive speeds Neg neer, hawkins, empty can, obriens, crossarm, subscap liftoff,   Neg ant drawer, sulcus sign, apprehension Negative Spurling's test bilat FROM of neck   Electronically signed by:  [facility] 9:36 AM 03/26/24

## 2024-03-26 ENCOUNTER — Ambulatory Visit: Admitting: Sports Medicine

## 2024-03-26 VITALS — BP 122/78 | HR 61 | Ht 72.0 in | Wt 217.0 lb

## 2024-03-26 DIAGNOSIS — M7521 Bicipital tendinitis, right shoulder: Secondary | ICD-10-CM

## 2024-03-26 DIAGNOSIS — M5136 Other intervertebral disc degeneration, lumbar region with discogenic back pain only: Secondary | ICD-10-CM

## 2024-03-26 DIAGNOSIS — G8929 Other chronic pain: Secondary | ICD-10-CM

## 2024-03-26 NOTE — Patient Instructions (Addendum)
 Thank you for coming in today Voltaren gel   Bicep HEP   For your back, pain is likely due to degenerative changes seen primarily at L3-L4 and L4-L5.  You have several treatment options for your low back moving forwards which include the following - Use NSAIDs g daily as needed for breakthrough pain.  Recommend limiting chronic NSAIDs to 1-2 doses per week to prevent long-term side effects. Use Tylenol  500 to 1000 mg tablets 2-3 times a day as needed for day-to-day pain relief. - Starting physical therapy and/or osteopathic manipulation - Proceeding with epidural CSI and if epidural CSI fails, continuing to medial branch block at levels of most significant degenerative change  For your shoulder, you are likely experiencing biceps tendinitis of the long head of biceps tendon. - Avoid biceps and overhead shoulder activities for 2 weeks and then gradually return to activity as tolerated - Use topical Voltaren gel over areas of pain - Start HEP for shoulder and biceps  Please reach out to our clinic when you are ready to take neck steps and treatment for your back and/or shoulder

## 2024-03-28 ENCOUNTER — Ambulatory Visit: Admitting: Sports Medicine

## 2024-03-29 ENCOUNTER — Encounter: Payer: Self-pay | Admitting: Sports Medicine

## 2024-03-29 DIAGNOSIS — G8929 Other chronic pain: Secondary | ICD-10-CM

## 2024-03-29 DIAGNOSIS — M5136 Other intervertebral disc degeneration, lumbar region with discogenic back pain only: Secondary | ICD-10-CM

## 2024-03-31 ENCOUNTER — Other Ambulatory Visit: Payer: Self-pay | Admitting: Sports Medicine

## 2024-03-31 DIAGNOSIS — M5136 Other intervertebral disc degeneration, lumbar region with discogenic back pain only: Secondary | ICD-10-CM

## 2024-03-31 DIAGNOSIS — G8929 Other chronic pain: Secondary | ICD-10-CM

## 2024-03-31 DIAGNOSIS — M545 Low back pain, unspecified: Secondary | ICD-10-CM

## 2024-03-31 NOTE — Progress Notes (Unsigned)
 L3-L4, either side

## 2024-03-31 NOTE — Telephone Encounter (Signed)
 Epidural placed

## 2024-04-15 ENCOUNTER — Encounter: Payer: Self-pay | Admitting: Sports Medicine

## 2024-04-21 NOTE — Discharge Instructions (Signed)

## 2024-04-22 ENCOUNTER — Inpatient Hospital Stay: Admission: RE | Admit: 2024-04-22 | Source: Ambulatory Visit

## 2024-04-22 ENCOUNTER — Inpatient Hospital Stay: Admission: RE | Admit: 2024-04-22 | Discharge: 2024-04-22 | Attending: Sports Medicine

## 2024-04-22 DIAGNOSIS — M545 Low back pain, unspecified: Secondary | ICD-10-CM

## 2024-04-22 DIAGNOSIS — M5136 Other intervertebral disc degeneration, lumbar region with discogenic back pain only: Secondary | ICD-10-CM

## 2024-04-22 MED ORDER — IOPAMIDOL (ISOVUE-M 200) INJECTION 41%
1.0000 mL | Freq: Once | INTRAMUSCULAR | Status: AC
  Administered 2024-04-22: 1 mL via INTRA_ARTICULAR

## 2024-04-22 MED ORDER — METHYLPREDNISOLONE ACETATE 40 MG/ML INJ SUSP (RADIOLOG
80.0000 mg | Freq: Once | INTRAMUSCULAR | Status: AC
  Administered 2024-04-22: 80 mg via EPIDURAL
